# Patient Record
Sex: Female | Born: 2000 | Race: White | Hispanic: No | Marital: Single | Smoking: Never smoker
Health system: Southern US, Community
[De-identification: ages and names within clinical notes are randomized; demographics above are authoritative.]

---
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
## 2022-11-28 ENCOUNTER — Other Ambulatory Visit: Payer: Self-pay | Admitting: Student

## 2022-11-28 DIAGNOSIS — R519 Headache, unspecified: Secondary | ICD-10-CM

## 2022-11-28 DIAGNOSIS — G43109 Migraine with aura, not intractable, without status migrainosus: Secondary | ICD-10-CM

## 2022-12-18 ENCOUNTER — Ambulatory Visit: Admission: RE | Admit: 2022-12-18 | Payer: BC Managed Care – PPO | Source: Ambulatory Visit

## 2023-01-01 ENCOUNTER — Ambulatory Visit: Payer: BC Managed Care – PPO

## 2023-01-11 ENCOUNTER — Other Ambulatory Visit: Payer: Self-pay | Admitting: Student

## 2023-01-11 DIAGNOSIS — R519 Headache, unspecified: Secondary | ICD-10-CM

## 2023-01-11 DIAGNOSIS — G43109 Migraine with aura, not intractable, without status migrainosus: Secondary | ICD-10-CM

## 2023-01-15 ENCOUNTER — Ambulatory Visit: Payer: BC Managed Care – PPO

## 2023-01-15 ENCOUNTER — Encounter: Payer: Self-pay | Admitting: Student

## 2023-01-22 ENCOUNTER — Ambulatory Visit
Admission: RE | Admit: 2023-01-22 | Discharge: 2023-01-22 | Disposition: A | Discharge status: Home / Self Care | Payer: BC Managed Care – PPO | Source: Ambulatory Visit | Attending: Student | Admitting: Student

## 2023-01-22 DIAGNOSIS — R519 Headache, unspecified: Secondary | ICD-10-CM

## 2023-01-22 DIAGNOSIS — G43109 Migraine with aura, not intractable, without status migrainosus: Secondary | ICD-10-CM

## 2023-09-24 ENCOUNTER — Other Ambulatory Visit: Payer: Self-pay

## 2023-09-24 DIAGNOSIS — N3000 Acute cystitis without hematuria: Secondary | ICD-10-CM | POA: Diagnosis not present

## 2023-09-24 DIAGNOSIS — R300 Dysuria: Secondary | ICD-10-CM | POA: Diagnosis present

## 2023-09-24 DIAGNOSIS — R109 Unspecified abdominal pain: Secondary | ICD-10-CM | POA: Insufficient documentation

## 2023-09-24 LAB — COMPREHENSIVE METABOLIC PANEL
ALT: 18 U/L (ref 0–44)
AST: 25 U/L (ref 15–41)
Albumin: 4.4 g/dL (ref 3.5–5.0)
Alkaline Phosphatase: 76 U/L (ref 38–126)
Anion gap: 9 (ref 5–15)
BUN: 15 mg/dL (ref 6–20)
CO2: 25 mmol/L (ref 22–32)
Calcium: 8.9 mg/dL (ref 8.9–10.3)
Chloride: 102 mmol/L (ref 98–111)
Creatinine, Ser: 0.91 mg/dL (ref 0.44–1.00)
GFR, Estimated: >60 mL/min (ref >60)
Glucose, Bld: 110 mg/dL — ABNORMAL HIGH (ref 70–99)
Potassium: 3.2 mmol/L — ABNORMAL LOW (ref 3.5–5.1)
Sodium: 136 mmol/L (ref 135–145)
Total Bilirubin: 0.4 mg/dL (ref <1.2)
Total Protein: 8.1 g/dL (ref 6.5–8.1)

## 2023-09-24 LAB — URINALYSIS, ROUTINE W REFLEX MICROSCOPIC
Bilirubin Urine: NEGATIVE
Glucose, UA: NEGATIVE mg/dL
Ketones, ur: NEGATIVE mg/dL
Nitrite: NEGATIVE
Protein, ur: 100 mg/dL — AB
Specific Gravity, Urine: 1.025 (ref 1.005–1.030)
WBC, UA: >50 WBC/hpf (ref 0–5)
pH: 7 (ref 5.0–8.0)

## 2023-09-24 LAB — CBC
HCT: 35.6 % — ABNORMAL LOW (ref 36.0–46.0)
Hemoglobin: 11.1 g/dL — ABNORMAL LOW (ref 12.0–15.0)
MCH: 22.9 pg — ABNORMAL LOW (ref 26.0–34.0)
MCHC: 31.2 g/dL (ref 30.0–36.0)
MCV: 73.4 fL — ABNORMAL LOW (ref 80.0–100.0)
Platelets: 417 10*3/uL — ABNORMAL HIGH (ref 150–400)
RBC: 4.85 MIL/uL (ref 3.87–5.11)
RDW: 15.8 % — ABNORMAL HIGH (ref 11.5–15.5)
WBC: 14.2 10*3/uL — ABNORMAL HIGH (ref 4.0–10.5)
nRBC: 0 % (ref 0.0–0.2)

## 2023-09-24 LAB — PREGNANCY, URINE: Preg Test, Ur: NEGATIVE

## 2023-09-24 LAB — LIPASE, BLOOD: Lipase: 30 U/L (ref 11–51)

## 2023-09-24 NOTE — ED Triage Notes (Signed)
Pt to ED via POV c/o pelvic pain and pain when urinating. Pt reports this has been going on for 2 days. Pt also says she noticed pinkish mucus when urinating.

## 2023-09-25 ENCOUNTER — Emergency Department
Admission: EM | Admit: 2023-09-25 | Discharge: 2023-09-25 | Disposition: A | Discharge status: Home / Self Care | Payer: BC Managed Care – PPO | Attending: Emergency Medicine | Admitting: Emergency Medicine

## 2023-09-25 DIAGNOSIS — N3000 Acute cystitis without hematuria: Secondary | ICD-10-CM

## 2023-09-25 MED ORDER — SULFAMETHOXAZOLE-TRIMETHOPRIM 800-160 MG PO TABS
1.0000 | ORAL_TABLET | Freq: Once | ORAL | Status: AC
  Administered 2023-09-25: 1 via ORAL
  Filled 2023-09-25: qty 1

## 2023-09-25 MED ORDER — SULFAMETHOXAZOLE-TRIMETHOPRIM 800-160 MG PO TABS: 1.0000 | ORAL_TABLET | Freq: Two times a day (BID) | ORAL | 0 refills | Status: AC

## 2023-09-25 NOTE — ED Provider Notes (Signed)
[facility] Provider Note    Event Date/Time   First MD Initiated Contact with Patient 09/25/23 [phone]     (approximate)   History   Abdominal Pain and Dysuria   HPI Katrina Le is a 22 y.o. female who is otherwise healthy and presents for evaluation of increased urinary frequency, pain when she urinates, and now some pain in the middle part of her lower pelvis.  She is not having any back pain.  She has had no fever, chills, nausea, vomiting, chest pain, nor shortness of breath.  She has no pain in the middle part of her abdomen.  She says she is with 1 partner and is not concerned about STDs.  She has had UTIs in the past but not for quite a while.     Physical Exam   Triage Vital Signs: ED Triage Vitals  Encounter Vitals Group     BP 09/24/23 2059 (!) 149/87     Systolic BP Percentile --      Diastolic BP Percentile --      Pulse Rate 09/24/23 2059 (!) 115     Resp 09/24/23 2059 18     Temp 09/24/23 2059 98.3 F (36.8 C)     Temp Source 09/24/23 2059 Oral     SpO2 09/24/23 2059 100 %     Weight 09/24/23 2057 77.1 kg (170 lb)     Height 09/24/23 2057 1.626 m (5\' 4" )     Head Circumference --      Peak Flow --      Pain Score 09/24/23 2056 6     Pain Loc --      Pain Education --      Exclude from Growth Chart --     Most recent vital signs: Vitals:   09/24/23 2059 09/25/23 0509  BP: (!) 149/87 121/79  Pulse: (!) 115 77  Resp: 18 16  Temp: 98.3 F (36.8 C)   SpO2: 100% 98%    General: Awake, no distress.  Well-appearing. CV:  Good peripheral perfusion.  Initially tachycardic in triage but resolved in the ED without further intervention. Resp:  Normal effort. Speaking easily and comfortably, no accessory muscle usage nor intercostal retractions.   Abd:  No distention.  Mild tenderness to palpation in the suprapubic region, otherwise normal. Other:  Deferred pelvic exam.   ED Results / Procedures / Treatments   Labs (all labs  ordered are listed, but only abnormal results are displayed) Labs Reviewed  COMPREHENSIVE METABOLIC PANEL - Abnormal; Notable for the following components:      Result Value   Potassium 3.2 (*)    Glucose, Bld 110 (*)    All other components within normal limits  CBC - Abnormal; Notable for the following components:   WBC 14.2 (*)    Hemoglobin 11.1 (*)    HCT 35.6 (*)    MCV 73.4 (*)    MCH 22.9 (*)    RDW 15.8 (*)    Platelets 417 (*)    All other components within normal limits  URINALYSIS, ROUTINE W REFLEX MICROSCOPIC - Abnormal; Notable for the following components:   Color, Urine YELLOW (*)    APPearance CLOUDY (*)    Hgb urine dipstick SMALL (*)    Protein, ur 100 (*)    Leukocytes,Ua MODERATE (*)    Bacteria, UA RARE (*)    All other components within normal limits  URINE CULTURE  LIPASE, BLOOD  PREGNANCY, URINE  PROCEDURES:  Critical Care performed: No  Procedures    IMPRESSION / MDM / ASSESSMENT AND PLAN / ED COURSE  I reviewed the triage vital signs and the nursing notes.                              Differential diagnosis includes, but is not limited to, UTI, pyelonephritis, STD, PID, TOA, appendicitis.  Patient's presentation is most consistent with acute presentation with potential threat to life or bodily function.  Labs/studies ordered: CMP, lipase, urine pregnancy test, CBC, and urinalysis  Interventions/Medications given:  Medications  sulfamethoxazole-trimethoprim (BACTRIM DS) 800-160 MG per tablet 1 tablet (1 tablet Oral Given 09/25/23 0510)    (Note:  hospital course my include additional interventions and/or labs/studies not listed above.)   Patient is well-appearing and in no distress, initially tachycardic, but that resolved without intervention.  She has only minimal discomfort and no signs of systemic infection.  No flank pain or tenderness and no tenderness to palpation of the abdomen except in the suprapubic region which is  consistent with acute cystitis.  I asked her about the possibility of STD and she is not concerned and understands that we can test for STDs but would prefer to just be treated for her urinary tract infection which is evident on her urinalysis.  I think that is reasonable.  She declined any shots or needles tonight, so I gave her a dose of Bactrim in the ED and prescription as listed below for a week of treatment given the possibility she may be developing into a complicated UTI.  I gave strict return precautions and she understands and agrees with the plan.         FINAL CLINICAL IMPRESSION(S) / ED DIAGNOSES   Final diagnoses:  Acute cystitis without hematuria     Rx / DC Orders   ED Discharge Orders          Ordered    sulfamethoxazole-trimethoprim (BACTRIM DS) 800-160 MG tablet  2 times daily        09/25/23 0432             Note:  This document was prepared using Dragon voice recognition software and may include unintentional dictation errors.   Loleta Rose, MD 09/25/23 1048

## 2023-09-25 NOTE — Discharge Instructions (Signed)
You have been seen in the Emergency Department (ED) today for pain when urinating.  Your workup today suggests that you have a urinary tract infection (UTI). ° °Please take your antibiotic as prescribed and over-the-counter pain medication (Tylenol or Motrin) as needed, but no more than recommended on the label instructions.  Drink PLENTY of fluids. ° °Call your regular doctor to schedule the next available appointment to follow up on today?s ED visit, or return immediately to the ED if your pain worsens, you have decreased urine production, develop fever, persistent vomiting, or other symptoms that concern you. ° °

## 2023-09-27 LAB — URINE CULTURE: Culture: 90000 — AB

## 2023-10-25 IMAGING — CT CT HEAD W/O CM
4 series · 16 of 47 positions shown, 18 images · non-contrast
Comparison: None.

CLINICAL DATA: Headaches



[Series 2: head wo · axial · 0.40mm/px · z∈[-79,+21]mm · 7 of 28 slices shown, 9 images]
[im 4/28  brain]
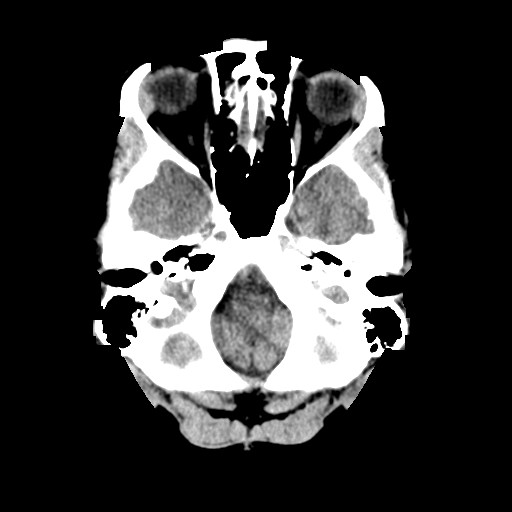
[im 4/28  bone]
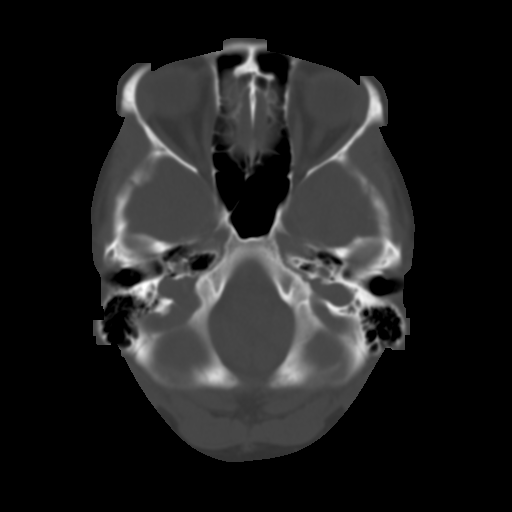
[im 7/28  brain]
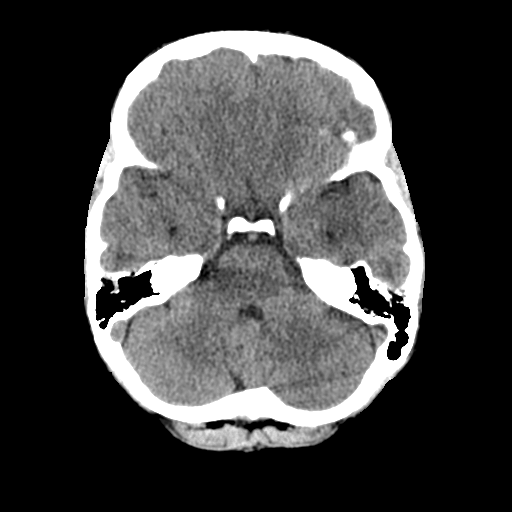
[im 11/28  brain]
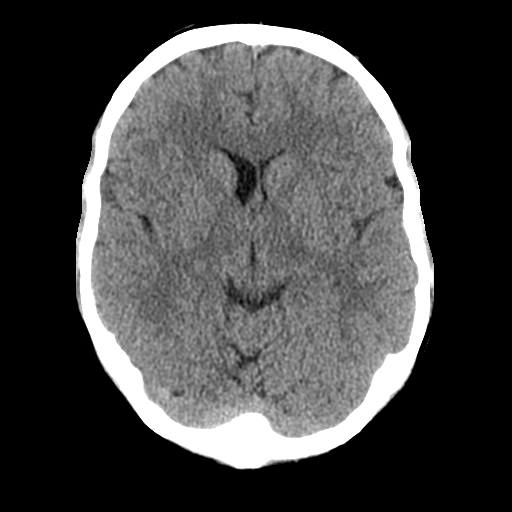
[im 14/28  brain]
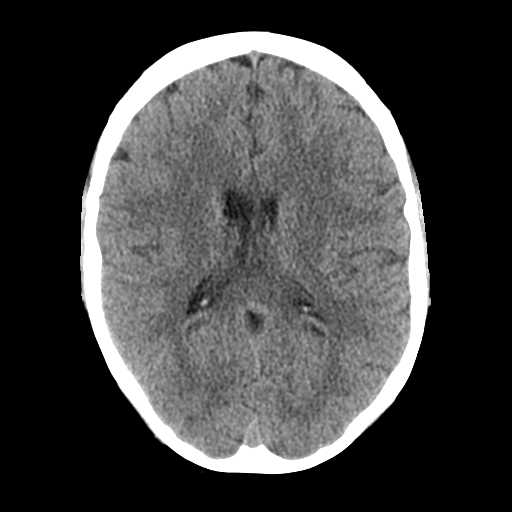
[im 17/28  brain]
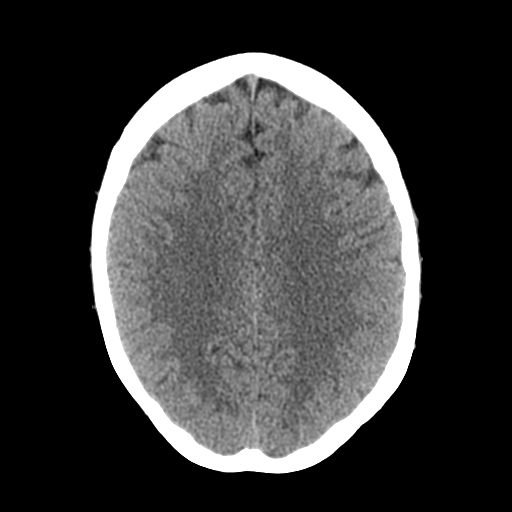
[im 17/28  bone]
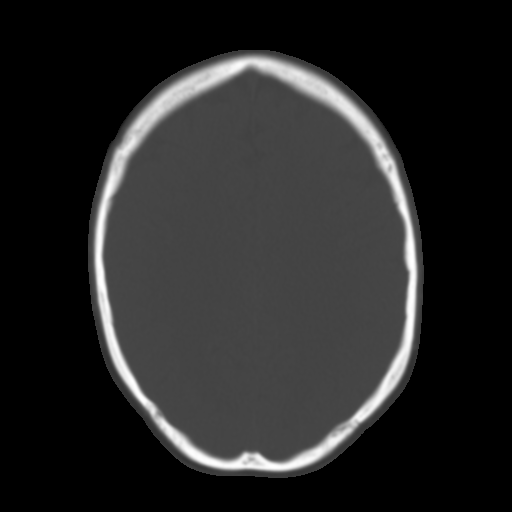
[im 21/28  brain]
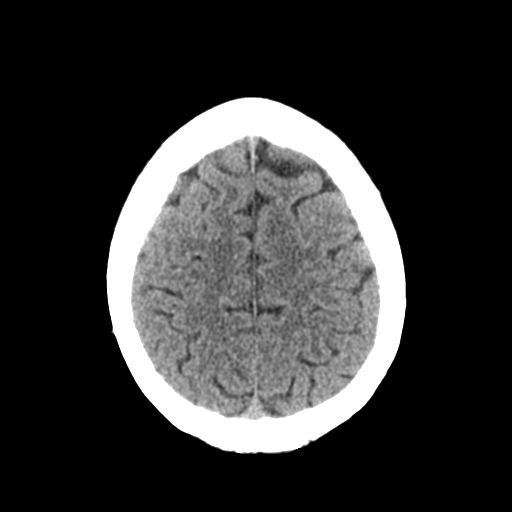
[im 24/28  brain]
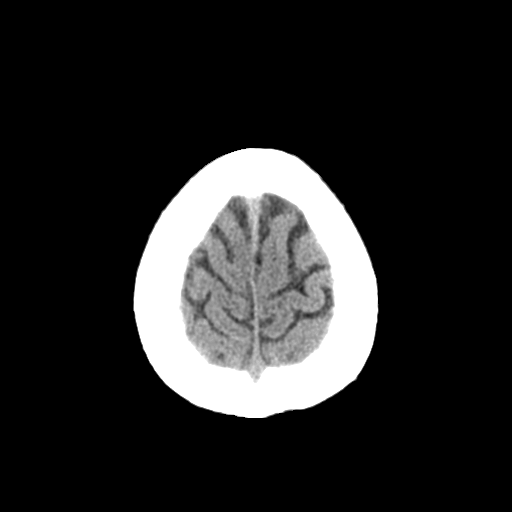

[Series 3: head bone · axial · 0.40mm/px · z∈[-82,-54]mm · 3 of 70 slices shown]
[im 7/70  bone]
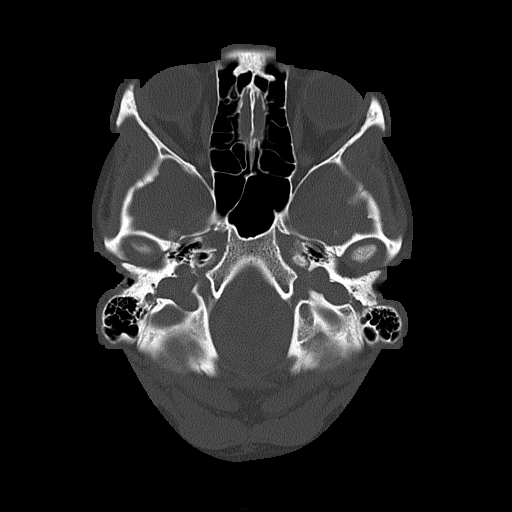
[im 14/70  bone]
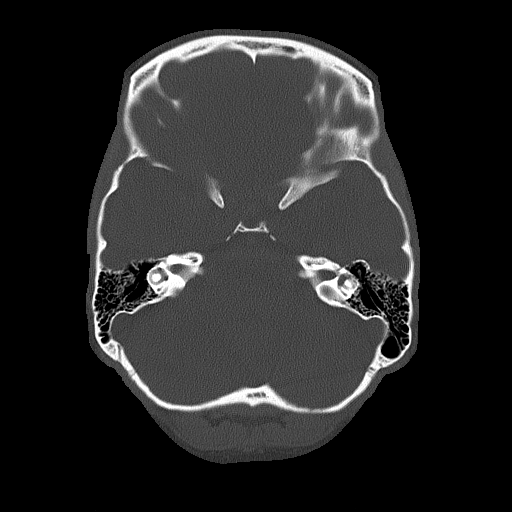
[im 21/70  bone]
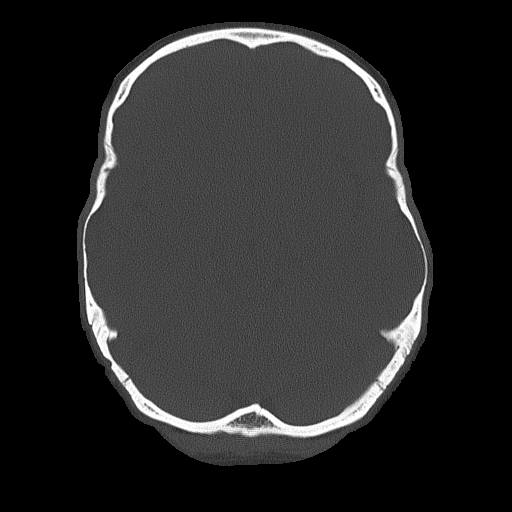

[Series 4: coronal soft tissue · coronal · 0.30mm/px · 3 of 63 slices shown]
[im 21/63  brain]
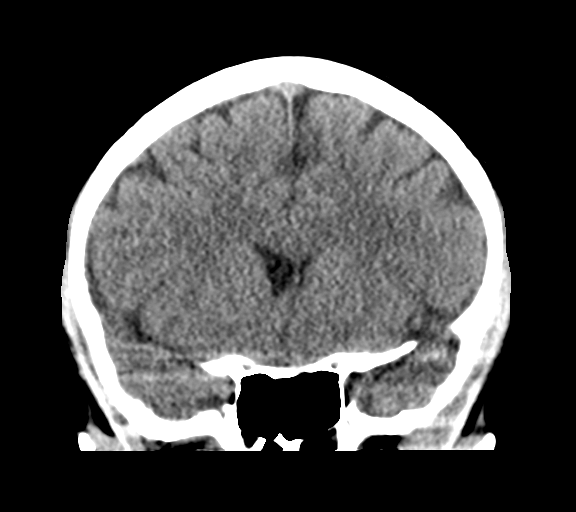
[im 28/63  brain]
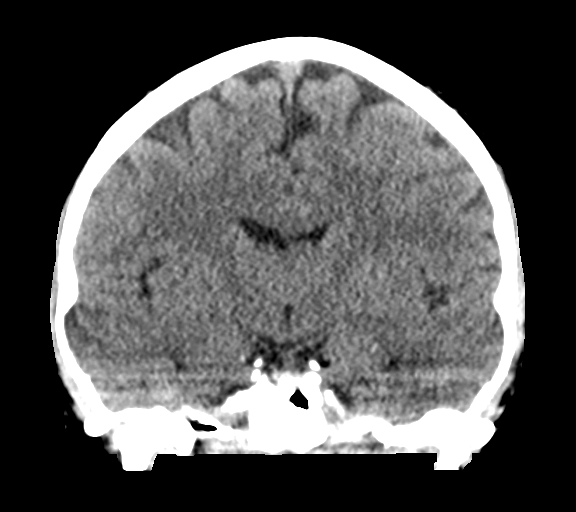
[im 35/63  brain]
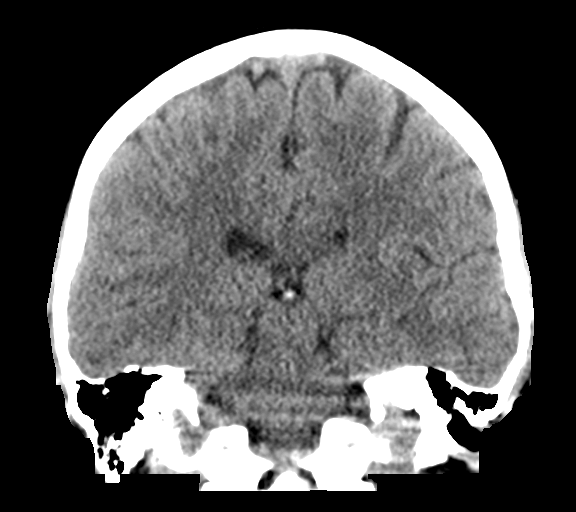

[Series 5: sagittal soft tissue · sagittal · 0.29mm/px · 3 of 54 slices shown]
[im 18/54  brain]
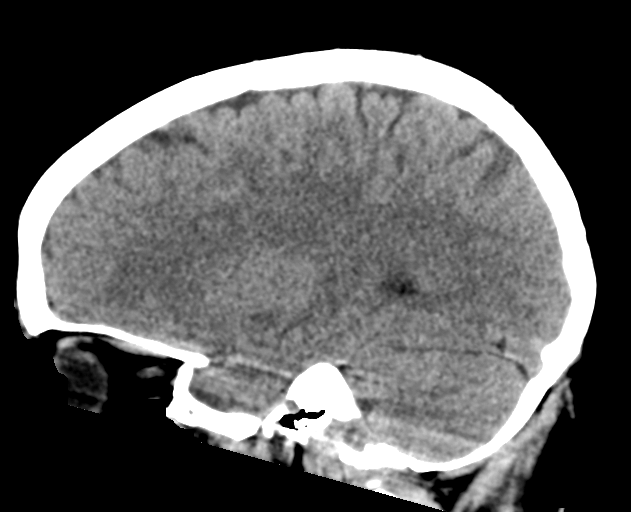
[im 27/54  brain]
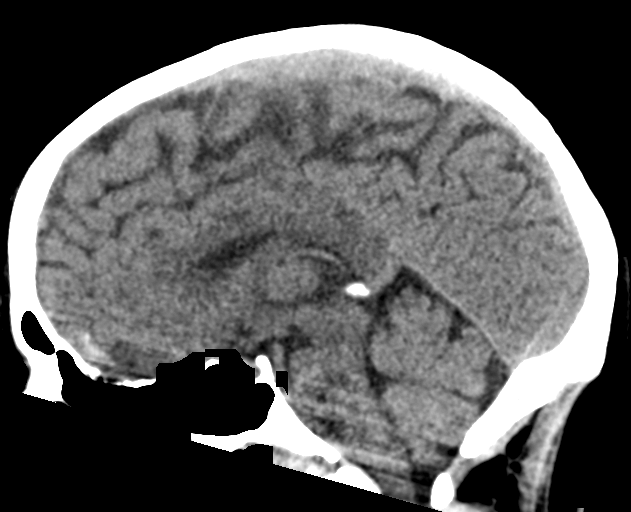
[im 36/54  brain]
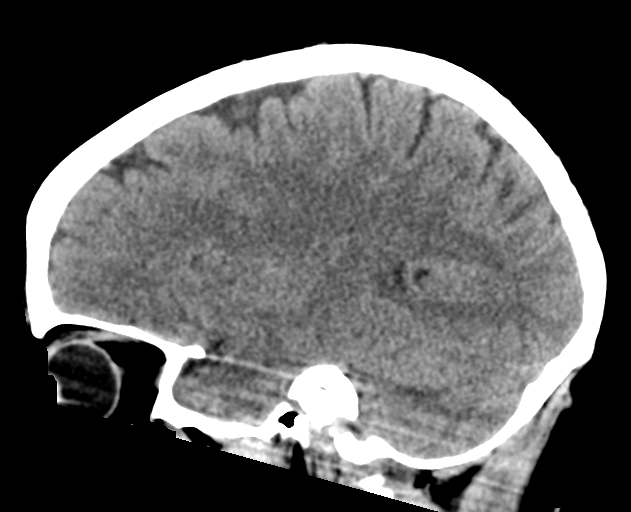

[16 of 47 positions shown; findings below may reference images not displayed]

FINDINGS: Brain: No acute intracranial findings are seen. Ventricles are not
dilated. Asymmetry in the size of lateral ventricles may be normal
variation. There is no shift of midline structures. There are no
epidural or subdural fluid collections.

Vascular: Unremarkable.

Skull: Unremarkable.

Sinuses/Orbits: Unremarkable.

Other: None
IMPRESSION: No acute intracranial findings are seen in noncontrast CT brain.

## 2023-10-25 IMAGING — CR DG CHEST 2V
2 series · 2 of 2 positions shown · non-contrast
Comparison: None.

CLINICAL DATA: Brain fog, dizziness, and headache.

EXAM:
CHEST - 2 VIEW

[chest pa]
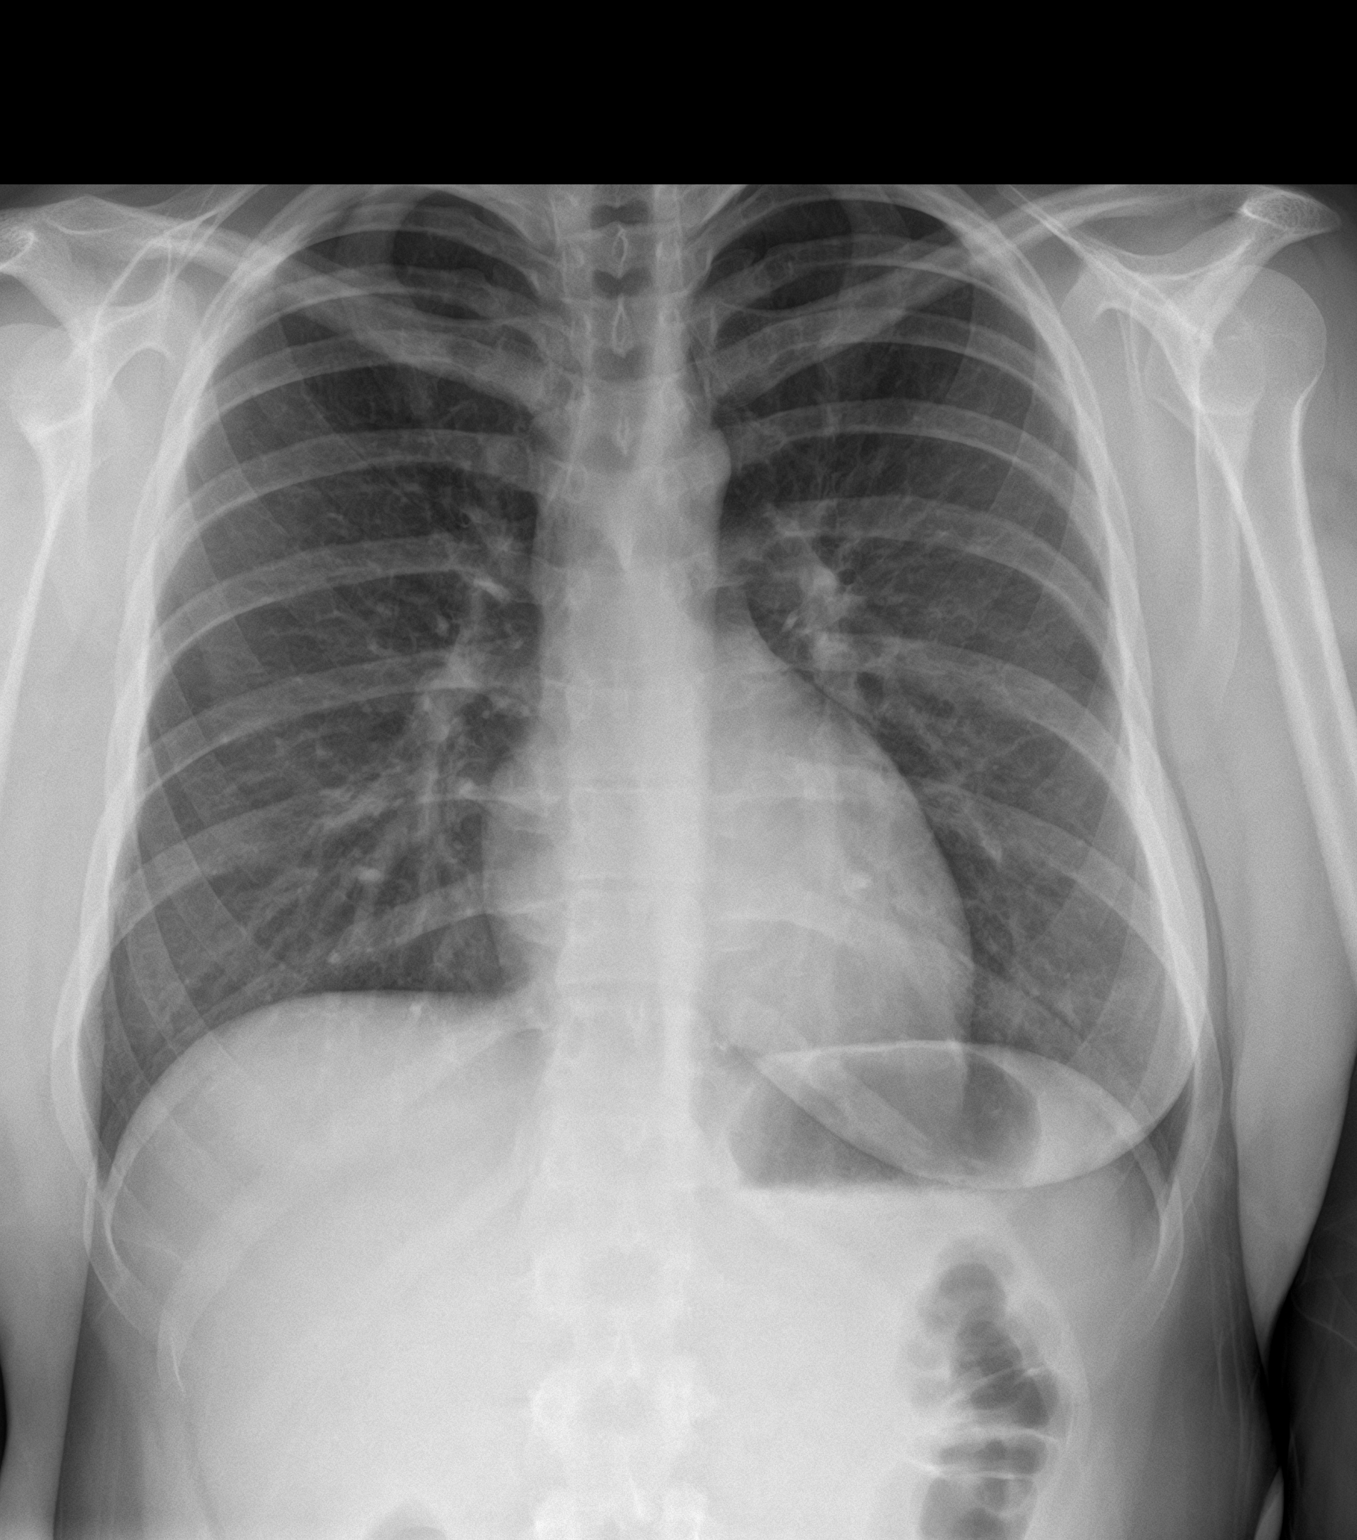

[chest lat]
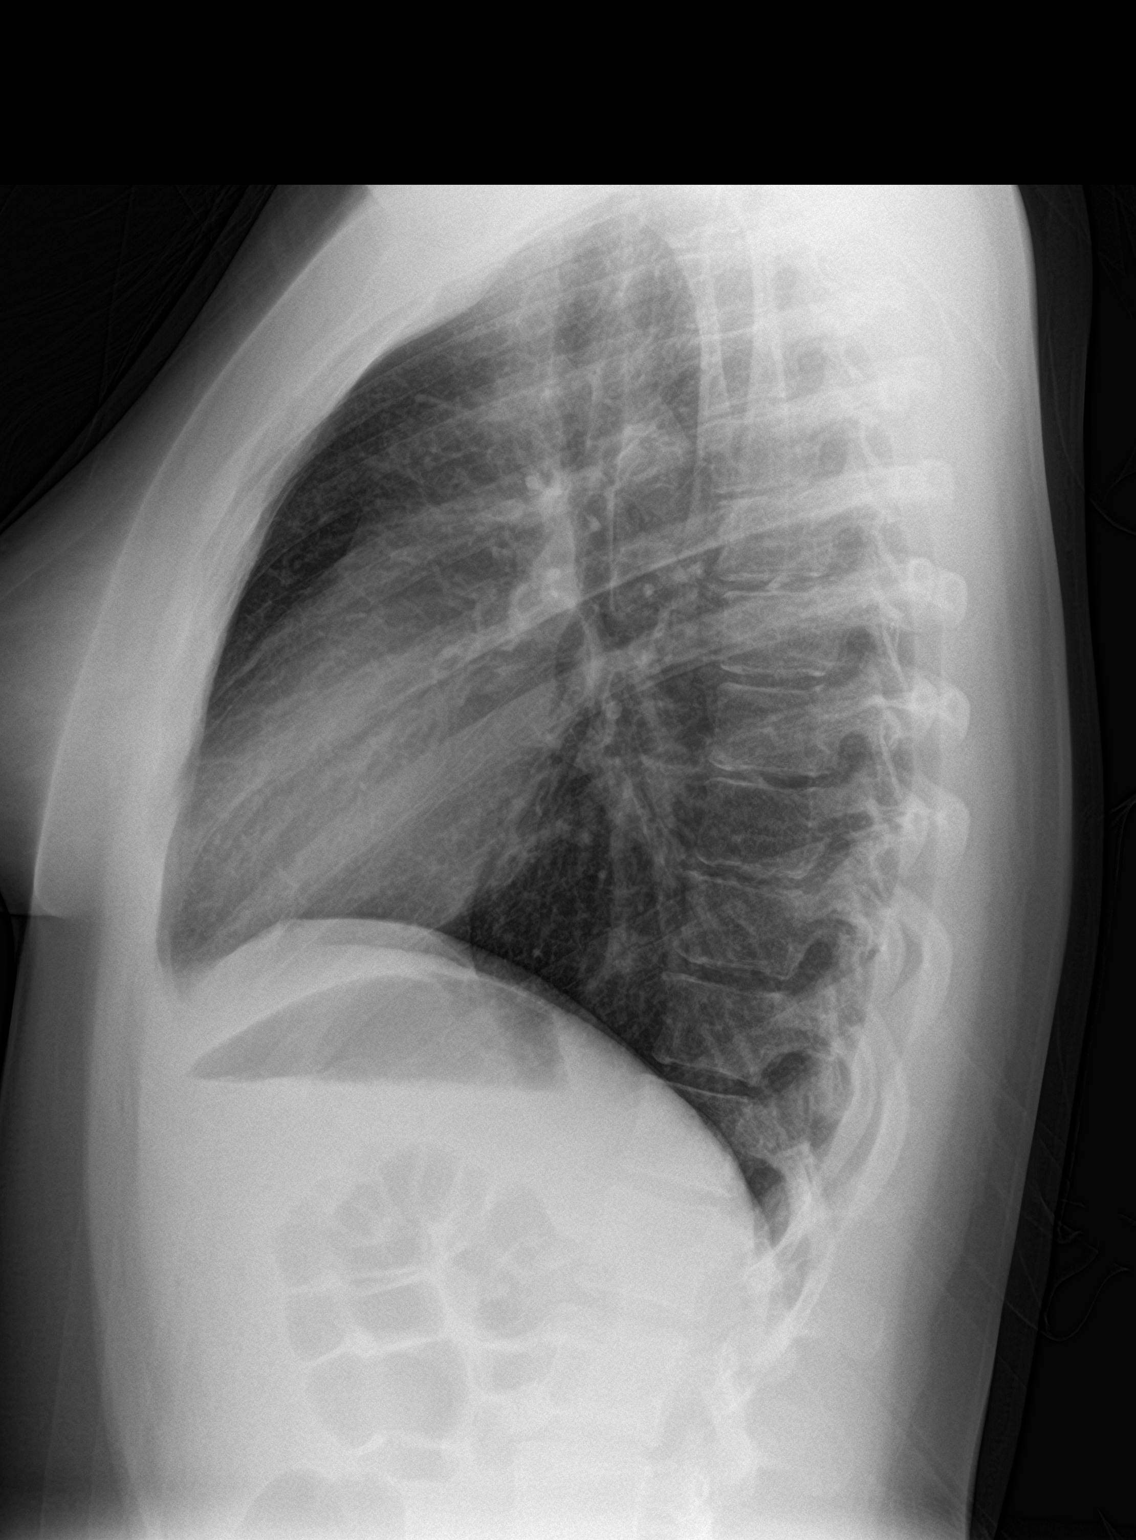

[2 of 2 positions shown; findings below may reference images not displayed]

FINDINGS: The heart size and mediastinal contours are within normal limits.
Both lungs are clear. The visualized skeletal structures are
unremarkable.
IMPRESSION: No active cardiopulmonary disease.

## 2024-08-26 ENCOUNTER — Encounter: Payer: Self-pay | Admitting: Psychiatry

## 2024-08-26 ENCOUNTER — Ambulatory Visit: Payer: Self-pay | Admitting: Psychiatry

## 2024-08-26 VITALS — BP 112/76 | HR 92 | Temp 98.7°F | Ht 64.0 in | Wt 171.0 lb

## 2024-08-26 DIAGNOSIS — F4010 Social phobia, unspecified: Secondary | ICD-10-CM

## 2024-08-26 DIAGNOSIS — F909 Attention-deficit hyperactivity disorder, unspecified type: Secondary | ICD-10-CM

## 2024-08-26 MED ORDER — SERTRALINE HCL 25 MG PO TABS: 25.0000 mg | ORAL_TABLET | Freq: Every day | ORAL | 0 refills | Status: DC

## 2024-08-26 NOTE — Progress Notes (Signed)
 Psychiatric Initial Adult Assessment   Patient Identification: Katrina Le MRN:  [id number] Date of Evaluation:  08/26/2024 Referral Source: Sherran Berliner PA-C Chief Complaint:  No chief complaint on file.  Visit Diagnosis:    ICD-10-CM   1. Social anxiety disorder  F40.10     2. Attention deficit hyperactivity disorder (ADHD), unspecified ADHD type  F90.9       History of Present Illness: 23 year old female presenting ARPA for new patient visit.  Patient currently is domiciled with her boyfriend in which she lives there reporting that she is having anxiety as well as trouble focusing in school stating she needs some help with medications.  Patient was seen at her neurologist in which they referred her to [facility] for evaluation.  Patient reports that she is having symptoms of anxiety with feeling panic or uneasiness when going up by herself stating she was feels better when she travels or goes out with someone, patient also states that social like anxiety and stating that she is often going home at night feeling overwhelmed with on accomplish tasks.  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  Patient reports that she is not doing well in school reporting that she had to withdraw from a course recently due to the fact that she was unable to keep up with classes.  Patient reports that she has not tried medications in the past denies SI, HI, AVH.  States that she has had therapy in the past but is currently not with a therapist.  Based on this assessment interview is recommended for the patient to start sertraline 25 mg once daily, and to possibly investigate possible ADHD-like symptoms in the near future.  Patient reports that she does have interest in starting with a therapist in which she was referred to [facility] counselor.  Patient with no other question concerns.  Patient is in agreement with treatment plan,  patient follow-up in 2 weeks.  Associated Signs/Symptoms: Depression Symptoms:  difficulty concentrating, (Hypo) Manic Symptoms:  Negative Anxiety Symptoms:  Excessive Worry, Psychotic Symptoms:  Negative PTSD Symptoms: Negative  Past Psychiatric History:  Previous Psych Hospitalizations: - Denies Outpatient treatment:  - Celeste Cantwell, PA-C Medications Current: - Sertraline 25mg , once daily  Next Steps: - Optimize medication and then investigate ADHD Medication Trials: - None Suicide & Violence: - None  Substance Use: - Denies Psychotherapy: - Referred to [employer]: - Denies  Previous Psychotropic Medications: No   Substance Abuse History in the last 12 months:  No.  Consequences of Substance Abuse: Negative  Past Medical History: No past medical history on file. No past surgical history on file.  Family Psychiatric History: No additional  Family History:  Family History  Problem Relation Age of Onset   Heart failure Paternal Grandmother        Died at 66   Breast cancer Neg Hx    Ovarian cancer Neg Hx    Colon cancer Neg Hx     Social History:   Social History   Socioeconomic History   Marital status: Single    Spouse name: Not on file   Number of children: Not on file   Years of education: Not on file   Highest education level: Not on file  Occupational History   Not on file  Tobacco Use   Smoking status: Never   Smokeless tobacco: Never   Tobacco comments:  Vaps occasionally  Vaping Use   Vaping status: Never Used  Substance and Sexual Activity   Alcohol use: Yes    Alcohol/week: 2.0 standard drinks of alcohol    Types: 2 Shots of liquor per week    Comment: 1-2 a week   Drug use: Never   Sexual activity: Yes    Partners: Male    Birth control/protection: I.U.D., Condom  Other Topics Concern   Not on file  Social History Narrative   Not on file   Social Drivers of Health   Financial Resource Strain: Not on file   Food Insecurity: Not on file  Transportation Needs: Not on file  Physical Activity: Not on file  Stress: Not on file  Social Connections: Not on file    Additional Social History: No additional  Allergies:  No Known Allergies  Metabolic Disorder Labs: No results found for: HGBA1C, MPG No results found for: PROLACTIN No results found for: CHOL, TRIG, HDL, CHOLHDL, VLDL, LDLCALC No results found for: TSH  Therapeutic Level Labs: No results found for: LITHIUM No results found for: CBMZ No results found for: VALPROATE  Current Medications: No current outpatient medications on file.   No current facility-administered medications for this visit.    Musculoskeletal: Strength & Muscle Tone: within normal limits Gait & Station: normal Patient leans: N/A  Psychiatric Specialty Exam: Review of Systems  Constitutional: Negative.   HENT: Negative.    Eyes: Negative.   Respiratory: Negative.    Cardiovascular: Negative.   Gastrointestinal: Negative.   Endocrine: Negative.   Genitourinary: Negative.   Musculoskeletal: Negative.   Skin: Negative.   Allergic/Immunologic: Negative.   Neurological: Negative.   Hematological: Negative.   Psychiatric/Behavioral:  Positive for decreased concentration and dysphoric mood. The patient is nervous/anxious.     Blood pressure 112/76, pulse 92, temperature 98.7 F (37.1 C), temperature source Temporal, height 5' 4 (1.626 m), weight 171 lb (77.6 kg), SpO2 100%, unknown if currently breastfeeding.Body mass index is 29.35 kg/m.  General Appearance: Well Groomed  Eye Contact:  Good  Speech:  Clear and Coherent  Volume:  Normal  Mood:  Anxious and Depressed  Affect:  Appropriate  Thought Process:  Coherent  Orientation:  Full (Time, Place, and Person)  Thought Content:  Logical  Suicidal Thoughts:  No  Homicidal Thoughts:  No  Memory:  Immediate;   Good Recent;   Good Remote;   Good  Judgement:  Good   Insight:  Good  Psychomotor Activity:  Normal  Concentration:  Concentration: Good and Attention Span: Good  Recall:  Good  Fund of Knowledge:Good  Language: Good  Akathisia:  No  Handed:  Ambidextrous  AIMS (if indicated):  done  Assets:  Desire for Improvement Financial Resources/Insurance Housing  ADL's:  Intact  Cognition: WNL  Sleep:  Good   Screenings: GAD-7    Flowsheet Row Office Visit [facility]26/20[facility]  Total GAD-7 Score 12   PHQ2-9    Flowsheet Row Office Visit from 08/26/2024 in [facility]  PHQ-2 Total Score 1   Flowsheet Row Office Visit from 08/26/2024 in [facility] from 09/25/2023 in [facility] ED from 12/16/2021 in [facility] at [facility]  C-SSRS RISK CATEGORY No Risk No Risk No Risk    Assessment and Plan:  Assessment - Diagnosis: Social anxiety disorder [F40.10]  2.   Attention deficit  hyperactivity disorder (ADHD), unspecified ADHD type [F90.9]   - Risk Factors: Worsening symptoms  Plan - Medications:  Start Sertraline 25mg  once daily, for 2 weeks.  - Psychotherapy: Pt referred to Sharry Groves - Education: Pt has been educated on how to reach this provider by calling the clinic or utilizing mychart.  Pt educated of medication, side effects and adverse effects.  - Follow-Up: Follow-up in 2 weeks.  - Referrals: No referrals - Safety Planning: The patient has been educated, if they should have suicidal thoughts with or without a plan to call 911, or go to the closest emergency department.  Pt verbalized understanding.  Pt denies firearms within the home.  Pt also agrees to call the clinic should they have worsening symptoms before the next appointment.    Patient/Guardian was advised Release of Information must be obtained prior to any  record release in order to collaborate their care with an outside provider. Patient/Guardian was advised if they have not already done so to contact the registration department to sign all necessary forms in order for us  to release information regarding their care.   Consent: Patient/Guardian gives verbal consent for treatment and assignment of benefits for services provided during this visit. Patient/Guardian expressed understanding and agreed to proceed.   Dorn Jama Der, NP 11/11/20251:11 PM

## 2024-09-10 ENCOUNTER — Encounter: Payer: Self-pay | Admitting: Psychiatry

## 2024-09-10 ENCOUNTER — Ambulatory Visit: Payer: Self-pay | Admitting: Psychiatry

## 2024-09-10 ENCOUNTER — Other Ambulatory Visit: Payer: Self-pay

## 2024-09-10 VITALS — BP 110/72 | HR 94 | Temp 98.0°F | Ht 64.0 in | Wt 170.8 lb

## 2024-09-10 DIAGNOSIS — F909 Attention-deficit hyperactivity disorder, unspecified type: Secondary | ICD-10-CM

## 2024-09-10 DIAGNOSIS — F4010 Social phobia, unspecified: Secondary | ICD-10-CM

## 2024-09-10 MED ORDER — SERTRALINE HCL 50 MG PO TABS: 50.0000 mg | ORAL_TABLET | Freq: Every day | ORAL | 2 refills | Status: AC
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
